# Patient Record
Sex: Female | Born: 1965 | Hispanic: Yes | Marital: Married | Smoking: Never smoker
Health system: Southern US, Community
[De-identification: ages and names within clinical notes are randomized; demographics above are authoritative.]

## PROBLEM LIST (undated history)

## (undated) DIAGNOSIS — Z789 Other specified health status: Secondary | ICD-10-CM

## (undated) HISTORY — PX: EYE SURGERY: SHX253

## (undated) HISTORY — DX: Other specified health status: Z78.9

---

## 2011-03-18 ENCOUNTER — Emergency Department: Payer: Self-pay | Admitting: Emergency Medicine

## 2016-06-13 ENCOUNTER — Ambulatory Visit: Payer: Self-pay | Attending: Oncology

## 2017-07-12 ENCOUNTER — Encounter (INDEPENDENT_AMBULATORY_CARE_PROVIDER_SITE_OTHER): Payer: Self-pay

## 2017-07-12 ENCOUNTER — Ambulatory Visit: Payer: Self-pay | Attending: Oncology

## 2017-07-12 ENCOUNTER — Other Ambulatory Visit: Payer: Self-pay

## 2017-07-12 ENCOUNTER — Ambulatory Visit
Admission: RE | Admit: 2017-07-12 | Discharge: 2017-07-12 | Disposition: A | Discharge status: Home / Self Care | Payer: Self-pay | Source: Ambulatory Visit | Attending: Oncology | Admitting: Oncology

## 2017-07-12 VITALS — BP 164/93 | HR 73 | Temp 97.0°F | Ht 63.0 in | Wt 109.0 lb

## 2017-07-12 DIAGNOSIS — N63 Unspecified lump in unspecified breast: Secondary | ICD-10-CM

## 2017-07-12 DIAGNOSIS — Z0000 Encounter for general adult medical examination without abnormal findings: Secondary | ICD-10-CM

## 2017-07-12 NOTE — Progress Notes (Signed)
Subjective:     Patient ID: Deanna Perry, female   DOB: 07/29/1965, 52 y.o.   MRN: [id number]  HPI   Review of Systems     Objective:   Physical Exam     Assessment:     none    Plan:     None

## 2017-07-12 NOTE — Progress Notes (Signed)
Subjective:     Patient ID: Deanna Perry, female   DOB: 07/16/1965, 52 y.o.   MRN: [id number]  HPI   Review of Systems     Objective:   Physical Exam  Pulmonary/Chest: Right breast exhibits no inverted nipple, no mass, no nipple discharge, no skin change and no tenderness. Left breast exhibits no inverted nipple, no mass, no nipple discharge, no skin change and no tenderness. Breasts are symmetrical.       Assessment:     52 year old hispanic patient presents for B[facility] clinic visit.  No prior mammogram.  Jaqui Laukaitis interpreted exam.  Patient is anxious.  Patient screened, and meets BCCCP eligibility.  Patient does not have insurance, Medicare or Medicaid.  Handout given on Affordable Care Act. Instructed patient on breast self awareness using teach back method.  CBE unremarkable.  No mass or lump palpated.    Plan:     Sent for bilateral screening mammogram.  Jaqui walked [facility].

## 2017-07-13 ENCOUNTER — Other Ambulatory Visit: Payer: Self-pay

## 2017-07-13 DIAGNOSIS — N63 Unspecified lump in unspecified breast: Secondary | ICD-10-CM

## 2017-08-11 ENCOUNTER — Ambulatory Visit
Admission: RE | Admit: 2017-08-11 | Discharge: 2017-08-11 | Disposition: A | Discharge status: Home / Self Care | Payer: Self-pay | Source: Ambulatory Visit | Attending: Oncology | Admitting: Oncology

## 2017-08-11 DIAGNOSIS — N63 Unspecified lump in unspecified breast: Secondary | ICD-10-CM

## 2017-08-14 ENCOUNTER — Other Ambulatory Visit: Payer: Self-pay | Admitting: *Deleted

## 2017-08-14 DIAGNOSIS — N63 Unspecified lump in unspecified breast: Secondary | ICD-10-CM

## 2017-08-23 ENCOUNTER — Other Ambulatory Visit: Payer: Self-pay | Admitting: *Deleted

## 2017-08-23 ENCOUNTER — Ambulatory Visit
Admission: RE | Admit: 2017-08-23 | Discharge: 2017-08-23 | Disposition: A | Discharge status: Home / Self Care | Payer: Self-pay | Source: Ambulatory Visit | Attending: Oncology | Admitting: Oncology

## 2017-08-23 DIAGNOSIS — N63 Unspecified lump in unspecified breast: Secondary | ICD-10-CM | POA: Insufficient documentation

## 2017-08-23 HISTORY — PX: BREAST BIOPSY: SHX20

## 2017-08-24 LAB — SURGICAL PATHOLOGY

## 2017-08-29 ENCOUNTER — Encounter: Payer: Self-pay | Admitting: *Deleted

## 2017-08-29 NOTE — Progress Notes (Signed)
Radiologist recommends surgical consult regarding concordance with biopsy/pathology.  Scheduled patient with Dr. Lemar Livings on Tuesday June 18,2019 at 10:45.  Patient to arrive at 10:15.  To bring photo ID, and current medications.

## 2017-10-03 ENCOUNTER — Ambulatory Visit: Payer: Self-pay

## 2017-10-03 ENCOUNTER — Encounter: Payer: Self-pay | Admitting: *Deleted

## 2017-10-03 ENCOUNTER — Ambulatory Visit (INDEPENDENT_AMBULATORY_CARE_PROVIDER_SITE_OTHER): Payer: Self-pay | Admitting: General Surgery

## 2017-10-03 VITALS — BP 152/98 | HR 71 | Resp 12 | Ht 63.0 in | Wt 110.0 lb

## 2017-10-03 DIAGNOSIS — N6312 Unspecified lump in the right breast, upper inner quadrant: Secondary | ICD-10-CM

## 2017-10-03 NOTE — Progress Notes (Signed)
Patient ID: Deanna Perry, female   DOB: [birth year], 52 y.o.   MRN: [id number]  Chief Complaint  Patient presents with  . Breast Problem    HPI Deanna Perry is a 52 y.o. female.  who presents for a breast evaluation post right breast biopsy referred by Molli Posey RN BCCCP. The most recent mammogram and biopsy was done on 08-23-17. She states she is nervous about the visit, friend present. Patient does perform regular self breast checks but could not feel anything different in the breast. Denies any breast injury or trauma. Her primary care recommended a mammogram, none prior. No pain post breast biopsy. She states she has been drinking tea (1 year) and she has lost 26 pounds  She is here with a friend, [name]. She works at [employer] in [employer]. Interpreter, Lendon Collar, present for interview, exam and discussion.  HPI  Past Medical History:  Diagnosis Date  . Patient denies medical problems     Past Surgical History:  Procedure Laterality Date  . BREAST BIOPSY Right 08/23/2017   FIBROTIC BREAST TISSUE, stereotatic /upper inner  . EYE SURGERY Left    over 30 yrs ago    Family History  Problem Relation Age of Onset  . Diabetes Father   . Colon cancer Neg Hx   . Breast cancer Neg Hx     Social History Social History   Tobacco Use  . Smoking status: Never Smoker  . Smokeless tobacco: Never Used  Substance Use Topics  . Alcohol use: Never    Frequency: Never  . Drug use: Never    No Known Allergies  Current Outpatient Medications  Medication Sig Dispense Refill  . Multiple Vitamin (MULTIVITAMIN) capsule Take 1 capsule by mouth daily.     No current facility-administered medications for this visit.     Review of Systems Review of Systems  Constitutional: Negative.   Respiratory: Negative.   Cardiovascular: Negative.     Blood pressure (!) 152/98, pulse 71, resp. rate 12, height 5\' 3"  (1.6 m), weight 110 lb (49.9 kg), SpO2 99  %.  Physical Exam Physical Exam  Constitutional: She is oriented to person, place, and time. She appears well-developed and well-nourished.  HENT:  Mouth/Throat: Oropharynx is clear and moist.  Eyes: Conjunctivae are normal. No scleral icterus.  Neck: Neck supple.  Cardiovascular: Normal rate, regular rhythm and normal heart sounds.  Pulmonary/Chest: Effort normal and breath sounds normal. Right breast exhibits no inverted nipple, no mass, no nipple discharge, no skin change and no tenderness. Left breast exhibits no inverted nipple, no mass, no nipple discharge, no skin change and no tenderness.  Left breast > right breast  Lymphadenopathy:    She has no cervical adenopathy.    She has no axillary adenopathy.  Neurological: She is alert and oriented to person, place, and time.  Skin: Skin is warm and dry.  Psychiatric: Her behavior is normal.    Data Reviewed Screening mammogram July 12, 2017 suggested asymmetry in the upper outer quadrant of the right breast.  Subsequent diagnostic study and ultrasound confirmed a focal area of abnormality.  The patient underwent a stereotactic biopsy on Aug 29, 2017 with the treating radiologist reporting the area of biopsy match the area of architectural and mammographic distortion.  Pathology was subsequently normal.  This is been listed as "discordant".  Review of the post biopsy films, although somewhat compromised as MLO views were obtained pre-biopsy and ML views obtained post biopsy suggested  for area of mammographic distortion had been removed.  Ultrasound examination of the 12 o'clock position of the right breast 5 cm from the nipple shows a focal hypoechoic area that is poorly defined without increased vascular flow noted on color-flow imaging.  BI-RADS-3.  Assessment    Abnormal mammogram, normal biopsy.    Plan    I broached the possibility of reviewing with the to radiate radiologist why he felt this was a discordant lesion when  normal breast parenchyma was included in his differential diagnosis.  The patient reported to the translator that any possibility that pathologic process is present would be worth undergoing an open biopsy.  Discussed excisional biopsy right breast mass with Outpatient Surgery     HPI, Physical Exam, Assessment and Plan have been scribed under the direction and in the presence of Earline MayotteJeffrey W. Laquinta Hazell, MD. Dorathy DaftMarsha Hatch, RN  I have completed the exam and reviewed the above documentation for accuracy and completeness.  I agree with the above.  Museum/gallery conservatorDragon Technology has been used and any errors in dictation or transcription are unintentional.  Donnalee CurryJeffrey Willye Javier, M.D., F.A.C.S.  Merrily PewJeffrey W Masaji Billups 10/03/2017, 9:01 PM

## 2017-10-03 NOTE — Patient Instructions (Addendum)
The patient is aware to call back for any questions or new concerns.  Discussed excisional biopsy right breast mass with Outpatient Surgery

## 2017-10-04 ENCOUNTER — Telehealth: Payer: Self-pay

## 2017-10-04 ENCOUNTER — Other Ambulatory Visit: Payer: Self-pay | Admitting: General Surgery

## 2017-10-04 DIAGNOSIS — N6312 Unspecified lump in the right breast, upper inner quadrant: Secondary | ICD-10-CM

## 2017-10-04 NOTE — Telephone Encounter (Signed)
Spoke with with patient's friend, Deanna Perry, to notify of surgery time and location and also Pre Admit date and time. [facility] at 9:30 am. She will pre admit at the hospital on 10/06/17 at 1:00 pm. She is aware of dates, times, and instructions. She may call to see if she can get a different day or time for pre admit testing.

## 2017-10-06 ENCOUNTER — Inpatient Hospital Stay: Admission: RE | Admit: 2017-10-06 | Payer: Self-pay | Source: Ambulatory Visit

## 2017-10-09 ENCOUNTER — Encounter
Admission: RE | Admit: 2017-10-09 | Discharge: 2017-10-09 | Disposition: A | Discharge status: Home / Self Care | Payer: Self-pay | Source: Ambulatory Visit | Attending: General Surgery | Admitting: General Surgery

## 2017-10-09 ENCOUNTER — Other Ambulatory Visit: Payer: Self-pay

## 2017-10-09 DIAGNOSIS — Z01818 Encounter for other preprocedural examination: Secondary | ICD-10-CM | POA: Insufficient documentation

## 2017-10-09 HISTORY — DX: Other specified health status: Z78.9

## 2017-10-09 NOTE — Patient Instructions (Addendum)
Your procedure is scheduled on: Friday October 13, 2017 9:40 am Su procedimiento est programado para: viernes el 28 de junio 2019 @ 9:40 am Report to Mammography, [facility]: [facility], Mammography  Remember: Instructions that are not followed completely may result in serious medical risk, up to and including death,  or upon the discretion of your surgeon and anesthesiologist your surgery may need to be rescheduled.  Recuerde: Las instrucciones que no se siguen completamente Armed forces logistics/support/administrative officer en un riesgo de salud grave, incluyendo hasta  la [place] o a discrecin de su cirujano y Scientific laboratory technician, su ciruga se puede posponer.   __X_ 1.Do not eat food after midnight the night before your procedure. No    gum chewing or hard candies. You may drink clear liquids up to 2 hours     before you are scheduled to arrive for your surgery- DO not drink clear     Liquids within 2 hours of the start of your surgery.     Clear Liquids include:    water, apple juice without pulp, clear carbohydrate drink such as    Clearfast of Gartorade, Black Coffee or Tea (Do not add anything to coffee or tea).      No coma nada despus de la medianoche de la noche anterior a su    procedimiento. No coma chicles ni caramelos duros. Puede tomar    lquidos claros hasta 2 horas antes de su hora programada de llegada al     hospital para su procedimiento. No tome lquidos claros durante el     transcurso de las 2 horas de su llegada programada al hospital para su     procedimiento, ya que esto puede llevar a que su procedimiento se    retrase o tenga que volver a Magazine features editor.  Los lquidos claros incluyen:          - Agua o jugo de [place] sin pulpa          - Bebidas claras con carbohidratos como ClearFast o Gatorade          - Caf negro o t claro (sin leche, sin cremas, no agregue nada al caf ni al t)  No tome nada que no est en esta lista.  Los pacientes con diabetes tipo 1  y tipo 2 solo deben Printmaker.  Llame a la clnica de PreCare o a la unidad de Same Day Surgery si  tiene alguna pregunta sobre estas instrucciones.              _X__ 2.Do Not Smoke or use e-cigarettes For 24 Hours Prior to Your Surgery.    Do not use any chewable tobacco products for at least 6   hours prior to surgery.    No fume ni use cigarrillos electrnicos durante las 24 horas previas    a su [place].  No use ningn producto de tabaco masticable durante   al menos 6 horas antes de la [place].     __X_ 3. No alcohol for 24 hours before or after surgery.    No tome alcohol durante las 24 horas antes ni despus de la [place].   ____4. Bring all medications with you on the day of surgery if instructed.    Lleve todos los medicamentos con usted el da de su ciruga si se le    ha indicado as.   ____ 5. Notify your doctor if there is any change in your medical condition (cold,fever, infections).  Informe a su mdico si hay algn cambio en su condicin mdica  (resfriado, fiebre, infecciones).   Do not wear jewelry, make-up, hairpins, clips or nail polish.  No use joyas, maquillajes, pinzas/ganchos para el cabello ni esmalte de uas.  Do not wear lotions, powders, or perfumes. You may wear deodorant.  No use lociones, polvos o perfumes.  Puede usar desodorante.    Do not shave 48 hours prior to surgery. Men may shave face and neck.  No se afeite 48 horas antes de la [place].  Los hombres pueden Commercial Metals Companyafeitarse la cara  y el cuello.   Do not bring valuables to the hospital.   No lleve objetos de valor al hospital.  [facility] is not responsible for any belongings or valuables.  [place] no se hace responsable de ningn tipo de pertenencias u objetos de Licensed conveyancervalor.               Contacts, dentures or bridgework may not be worn into surgery.  Los lentes de Letchercontacto, las dentaduras postizas o puentes no se pueden usar en la [place].   Leave your suitcase in the car. After surgery it may  be brought to your room.  Deje su maleta en el auto.  Despus de la ciruga podr traerla a su habitacin.   For patients admitted to the hospital, discharge time is determined by your  treatment team.  Para los pacientes que sean ingresados al hospital, el tiempo en el cual se le  dar de alta es determinado por su equipo de [place]tratamiento.   Patients discharged the day of surgery will not be allowed to drive home. A los pacientes que se les da de alta el mismo da de la ciruga no se les permitir conducir a Higher education careers advisercasa.   Please read over the following fact sheets that you were given: Por favor lea las siguientes hojas de informacin que le dieron:    _x___ Take these medicines the morning of surgery with A SIP OF WATER:          Owens-Illinoisome estas medicinas la maana de la ciruga con UN SORBO DE AGUA:  1. Nada (None)    _x___ Use CHG Soap as directed          Utilice el jabn de CHG segn lo indicado   _x___ Stop supplements now until after surgery            Deje de tomar suplementos hoy hasta despus de la [place]

## 2017-10-13 ENCOUNTER — Ambulatory Visit
Admission: RE | Admit: 2017-10-13 | Discharge: 2017-10-13 | Disposition: A | Discharge status: Home / Self Care | Payer: Self-pay | Source: Ambulatory Visit | Attending: General Surgery | Admitting: General Surgery

## 2017-10-13 ENCOUNTER — Ambulatory Visit: Payer: Self-pay | Admitting: Anesthesiology

## 2017-10-13 ENCOUNTER — Encounter
Admission: RE | Disposition: A | Discharge status: Home / Self Care | Payer: Self-pay | Source: Ambulatory Visit | Attending: General Surgery

## 2017-10-13 ENCOUNTER — Encounter: Payer: Self-pay | Admitting: *Deleted

## 2017-10-13 DIAGNOSIS — N6312 Unspecified lump in the right breast, upper inner quadrant: Secondary | ICD-10-CM

## 2017-10-13 DIAGNOSIS — N6031 Fibrosclerosis of right breast: Secondary | ICD-10-CM | POA: Insufficient documentation

## 2017-10-13 HISTORY — PX: BREAST EXCISIONAL BIOPSY: SUR124

## 2017-10-13 HISTORY — PX: BREAST BIOPSY: SHX20

## 2017-10-13 SURGERY — BREAST BIOPSY WITH NEEDLE LOCALIZATION
Anesthesia: General | Laterality: Right | Wound class: "Clean "

## 2017-10-13 MED ORDER — KETOROLAC TROMETHAMINE 30 MG/ML IJ SOLN
INTRAMUSCULAR | Status: AC
  Filled 2017-10-13: qty 1

## 2017-10-13 MED ORDER — PROPOFOL 10 MG/ML IV BOLUS
INTRAVENOUS | Status: DC | PRN
  Administered 2017-10-13: 150 mg via INTRAVENOUS

## 2017-10-13 MED ORDER — FENTANYL CITRATE (PF) 100 MCG/2ML IJ SOLN
INTRAMUSCULAR | Status: AC
  Filled 2017-10-13: qty 2

## 2017-10-13 MED ORDER — BUPIVACAINE-EPINEPHRINE 0.5% -1:200000 IJ SOLN
INTRAMUSCULAR | Status: DC | PRN
  Administered 2017-10-13: 20 mL

## 2017-10-13 MED ORDER — GABAPENTIN 300 MG PO CAPS
ORAL_CAPSULE | ORAL | Status: AC
  Administered 2017-10-13: 300 mg via ORAL
  Filled 2017-10-13: qty 1

## 2017-10-13 MED ORDER — MIDAZOLAM HCL 2 MG/2ML IJ SOLN
INTRAMUSCULAR | Status: DC | PRN
  Administered 2017-10-13: 2 mg via INTRAVENOUS

## 2017-10-13 MED ORDER — KETOROLAC TROMETHAMINE 30 MG/ML IJ SOLN
INTRAMUSCULAR | Status: DC | PRN
  Administered 2017-10-13: 30 mg via INTRAVENOUS

## 2017-10-13 MED ORDER — ONDANSETRON HCL 4 MG/2ML IJ SOLN
INTRAMUSCULAR | Status: DC | PRN
  Administered 2017-10-13: 4 mg via INTRAVENOUS

## 2017-10-13 MED ORDER — LIDOCAINE HCL (PF) 2 % IJ SOLN
INTRAMUSCULAR | Status: AC
  Filled 2017-10-13: qty 10

## 2017-10-13 MED ORDER — ACETAMINOPHEN 10 MG/ML IV SOLN
INTRAVENOUS | Status: AC
  Filled 2017-10-13: qty 100

## 2017-10-13 MED ORDER — CELECOXIB 200 MG PO CAPS
200.0000 mg | ORAL_CAPSULE | ORAL | Status: AC
  Administered 2017-10-13: 200 mg via ORAL

## 2017-10-13 MED ORDER — ONDANSETRON HCL 4 MG/2ML IJ SOLN
INTRAMUSCULAR | Status: AC
  Filled 2017-10-13: qty 2

## 2017-10-13 MED ORDER — CELECOXIB 200 MG PO CAPS
ORAL_CAPSULE | ORAL | Status: AC
  Administered 2017-10-13: 200 mg via ORAL
  Filled 2017-10-13: qty 1

## 2017-10-13 MED ORDER — ACETAMINOPHEN 10 MG/ML IV SOLN
INTRAVENOUS | Status: DC | PRN
  Administered 2017-10-13: 1000 mg via INTRAVENOUS

## 2017-10-13 MED ORDER — FAMOTIDINE 20 MG PO TABS
20.0000 mg | ORAL_TABLET | Freq: Once | ORAL | Status: AC
  Administered 2017-10-13: 20 mg via ORAL

## 2017-10-13 MED ORDER — DEXAMETHASONE SODIUM PHOSPHATE 10 MG/ML IJ SOLN
INTRAMUSCULAR | Status: DC | PRN
Start: 2017-10-13 — End: 2017-10-13
  Administered 2017-10-13: 4 mg via INTRAVENOUS

## 2017-10-13 MED ORDER — MIDAZOLAM HCL 2 MG/2ML IJ SOLN
INTRAMUSCULAR | Status: AC
  Filled 2017-10-13: qty 2

## 2017-10-13 MED ORDER — GABAPENTIN 300 MG PO CAPS
300.0000 mg | ORAL_CAPSULE | ORAL | Status: AC
  Administered 2017-10-13: 300 mg via ORAL

## 2017-10-13 MED ORDER — DEXAMETHASONE SODIUM PHOSPHATE 10 MG/ML IJ SOLN
INTRAMUSCULAR | Status: AC
  Filled 2017-10-13: qty 1

## 2017-10-13 MED ORDER — SEVOFLURANE IN SOLN
RESPIRATORY_TRACT | Status: AC
  Filled 2017-10-13: qty 250

## 2017-10-13 MED ORDER — HYDROCODONE-ACETAMINOPHEN 5-325 MG PO TABS: 1.0000 | ORAL_TABLET | ORAL | 0 refills | Status: AC | PRN

## 2017-10-13 MED ORDER — LACTATED RINGERS IV SOLN
INTRAVENOUS | Status: DC
  Administered 2017-10-13: 13:00:00 via INTRAVENOUS
  Administered 2017-10-13: 75 mL/h via INTRAVENOUS

## 2017-10-13 MED ORDER — ONDANSETRON HCL 4 MG/2ML IJ SOLN: 4.0000 mg | Freq: Once | INTRAMUSCULAR | Status: DC | PRN

## 2017-10-13 MED ORDER — FENTANYL CITRATE (PF) 100 MCG/2ML IJ SOLN
INTRAMUSCULAR | Status: DC | PRN
  Administered 2017-10-13 (×2): 25 ug via INTRAVENOUS

## 2017-10-13 MED ORDER — PROPOFOL 500 MG/50ML IV EMUL
INTRAVENOUS | Status: AC
  Filled 2017-10-13: qty 50

## 2017-10-13 MED ORDER — LIDOCAINE HCL (CARDIAC) PF 100 MG/5ML IV SOSY
PREFILLED_SYRINGE | INTRAVENOUS | Status: DC | PRN
  Administered 2017-10-13: 60 mg via INTRAVENOUS

## 2017-10-13 MED ORDER — FAMOTIDINE 20 MG PO TABS
ORAL_TABLET | ORAL | Status: AC
  Administered 2017-10-13: 20 mg via ORAL
  Filled 2017-10-13: qty 1

## 2017-10-13 MED ORDER — FENTANYL CITRATE (PF) 100 MCG/2ML IJ SOLN: 25.0000 ug | INTRAMUSCULAR | Status: DC | PRN

## 2017-10-13 SURGICAL SUPPLY — 43 items
BINDER BREAST LRG (GAUZE/BANDAGES/DRESSINGS) IMPLANT
BINDER BREAST MEDIUM (GAUZE/BANDAGES/DRESSINGS) ×2 IMPLANT
BINDER BREAST XLRG (GAUZE/BANDAGES/DRESSINGS) IMPLANT
BINDER BREAST XXLRG (GAUZE/BANDAGES/DRESSINGS) IMPLANT
BLADE SURG 15 STRL SS SAFETY (BLADE) ×6 IMPLANT
CANISTER SUCT 1200ML W/VALVE (MISCELLANEOUS) ×3 IMPLANT
CHLORAPREP W/TINT 26ML (MISCELLANEOUS) ×6 IMPLANT
CLOSURE WOUND 1/2 X4 (GAUZE/BANDAGES/DRESSINGS) ×2
CNTNR SPEC 2.5X3XGRAD LEK (MISCELLANEOUS)
CONT SPEC 4OZ STER OR WHT (MISCELLANEOUS)
CONTAINER SPEC 2.5X3XGRAD LEK (MISCELLANEOUS) ×1 IMPLANT
COVER PROBE FLX POLY STRL (MISCELLANEOUS) ×3 IMPLANT
DEVICE DUBIN SPECIMEN MAMMOGRA (MISCELLANEOUS) ×3 IMPLANT
DRAPE LAPAROTOMY 100X77 ABD (DRAPES) ×3 IMPLANT
DRSG GAUZE FLUFF 36X18 (GAUZE/BANDAGES/DRESSINGS) ×3 IMPLANT
DRSG TELFA 4X3 1S NADH ST (GAUZE/BANDAGES/DRESSINGS) ×6 IMPLANT
ELECT CAUTERY BLADE TIP 2.5 (TIP) ×3
ELECT REM PT RETURN 9FT ADLT (ELECTROSURGICAL) ×3
ELECTRODE CAUTERY BLDE TIP 2.5 (TIP) ×1 IMPLANT
ELECTRODE REM PT RTRN 9FT ADLT (ELECTROSURGICAL) ×1 IMPLANT
GLOVE BIO SURGEON STRL SZ7.5 (GLOVE) ×5 IMPLANT
GLOVE INDICATOR 8.0 STRL GRN (GLOVE) ×5 IMPLANT
GOWN STRL REUS W/ TWL LRG LVL3 (GOWN DISPOSABLE) ×2 IMPLANT
GOWN STRL REUS W/TWL LRG LVL3 (GOWN DISPOSABLE) ×4
KIT TURNOVER KIT A (KITS) ×3 IMPLANT
LABEL OR SOLS (LABEL) ×3 IMPLANT
MARGIN MAP 10MM (MISCELLANEOUS) ×3 IMPLANT
NDL HYPO 25X1 1.5 SAFETY (NEEDLE) ×1 IMPLANT
NEEDLE HYPO 22GX1.5 SAFETY (NEEDLE) ×3 IMPLANT
NEEDLE HYPO 25X1 1.5 SAFETY (NEEDLE) ×3 IMPLANT
PACK BASIN MINOR ARMC (MISCELLANEOUS) ×3 IMPLANT
RETRACTOR RING XSMALL (MISCELLANEOUS) IMPLANT
RTRCTR WOUND ALEXIS 13CM XS SH (MISCELLANEOUS)
STRIP CLOSURE SKIN 1/2X4 (GAUZE/BANDAGES/DRESSINGS) ×4 IMPLANT
SUT ETHILON 3-0 FS-10 30 BLK (SUTURE) ×3
SUT VIC AB 2-0 CT1 27 (SUTURE) ×2
SUT VIC AB 2-0 CT1 TAPERPNT 27 (SUTURE) ×1 IMPLANT
SUT VIC AB 4-0 FS2 27 (SUTURE) ×3 IMPLANT
SUTURE EHLN 3-0 FS-10 30 BLK (SUTURE) ×1 IMPLANT
SWABSTK COMLB BENZOIN TINCTURE (MISCELLANEOUS) ×6 IMPLANT
SYR 10ML LL (SYRINGE) ×3 IMPLANT
TAPE TRANSPORE STRL 2 31045 (GAUZE/BANDAGES/DRESSINGS) ×1 IMPLANT
WATER STERILE IRR 1000ML POUR (IV SOLUTION) ×3 IMPLANT

## 2017-10-13 NOTE — Anesthesia Procedure Notes (Signed)
Procedure Name: LMA Insertion Date/Time: 10/13/2017 12:53 PM Performed by: Oliva Bustardorbett, Florentina Marquart E, CRNA Pre-anesthesia Checklist: Patient identified, Emergency Drugs available, Suction available, Patient being monitored and Timeout performed Patient Re-evaluated:Patient Re-evaluated prior to induction Oxygen Delivery Method: Circle system utilized Preoxygenation: Pre-oxygenation with 100% oxygen Induction Type: IV induction LMA: LMA inserted LMA Size: 3.5 Number of attempts: 1 Placement Confirmation: positive ETCO2 Dental Injury: Teeth and Oropharynx as per pre-operative assessment

## 2017-10-13 NOTE — Op Note (Signed)
Preoperative diagnosis: Abnormal mammogram, benign stereotactic biopsy, discordant per radiology.  Postoperative diagnosis: Same.  Operative procedure: Wire localization excision of previous biopsy site.  Operating Surgeon: Donnalee CurryJeffrey Megon Kalina, MD.  Anesthesia: General by LMA, Marcaine 0.5% with 1 to 200,000 units of epinephrine, 20 cc.  Estimated blood loss: Less than 3 cc.  Clinical note: This 52 year old woman has screening mammogram with area of questionable density in the 12 o'clock position of the right breast.  Stereotactic biopsy showed fibrotic breast tissue without epithelial proliferation, atypia or malignancy.  This was felt to be discordant by the radiologist who recommended formal excision.  In discussion with the patient she desired to remove any doubt of pathology and for that reason is brought to the operating at this time having undergone wire localization of the previous site of distortion and clip placement.  Operative note: With the patient under adequate general anesthesia the area was cleansed with ChloraPrep and draped.  The area of the estimated clip and wire tip were identified by ultrasound in the area marked.  A curvilinear incision on the 11 to 12 o'clock position was made lateral to the wire insertion site.  Skin was incised sharply after installation of local anesthetic.  Tissue was deepened and the wire was brought into the wound.  A 1-1/2 cm diameter core of tissue down to and including pectoralis fascia was removed.  Specimen radiograph confirmed the entire wire tip, previous biopsy clip and area of distortion.  The breast parenchyma with an fascia was elevated off the underlying pectoralis muscle and this was then approximated with a single 2-0 Vicryl figure-of-eight suture.  The deep breast parenchyma was approximated in a similar fashion.  The adipose layer was approximated with a single 2-0 Vicryl simple suture.  The skin was closed with a running 4-0 Vicryl  subcuticular suture.  Benzoin, Steri-Strips, Telfa and Tegaderm dressing was applied.  The patient tolerated the procedure well was taken recovery room in stable condition.

## 2017-10-13 NOTE — Anesthesia Postprocedure Evaluation (Signed)
Anesthesia Post Note  Patient: Deanna Perry  Procedure(s) Performed: BREAST BIOPSY WITH NEEDLE LOCALIZATION (Right )  Patient location during evaluation: PACU Anesthesia Type: General Level of consciousness: awake and alert and oriented Pain management: pain level controlled Vital Signs Assessment: post-procedure vital signs reviewed and stable Respiratory status: spontaneous breathing Cardiovascular status: blood pressure returned to baseline Anesthetic complications: no     Last Vitals:  Vitals:   10/13/17 1431 10/13/17 1515  BP: 122/74 113/85  Pulse: 62 63  Resp: 16   Temp: 36.4 C   SpO2: 100% 99%    Last Pain:  Vitals:   10/13/17 1515  TempSrc:   PainSc: 0-No pain                 Emrie Gayle

## 2017-10-13 NOTE — Transfer of Care (Signed)
Immediate Anesthesia Transfer of Care Note  Patient: Deanna Perry  Procedure(s) Performed: BREAST BIOPSY WITH NEEDLE LOCALIZATION (Right )  Patient Location: PACU  Anesthesia Type:General  Level of Consciousness: awake  Airway & Oxygen Therapy: Patient Spontanous Breathing  Post-op Assessment: Report given to RN  Post vital signs: stable  Last Vitals:  Vitals Value Taken Time  BP 117/70 10/13/2017  1:40 PM  Temp 36.8 C 10/13/2017  1:40 PM  Pulse 78 10/13/2017  1:43 PM  Resp 12 10/13/2017  1:43 PM  SpO2 100 % 10/13/2017  1:43 PM  Vitals shown include unvalidated device data.  Last Pain:  Vitals:   10/13/17 1340  TempSrc:   PainSc: Asleep      Patients Stated Pain Goal: 0 (10/13/17 1039)  Complications: No apparent anesthesia complications

## 2017-10-13 NOTE — Anesthesia Preprocedure Evaluation (Signed)
Anesthesia Evaluation  Patient identified by MRN, date of birth, ID band  Reviewed: Allergy & Precautions, NPO status , Patient's Chart, lab work & pertinent test results  History of Anesthesia Complications Negative for: history of anesthetic complications  Airway Mallampati: III       Dental   Pulmonary neg sleep apnea, neg COPD,           Cardiovascular (-) hypertension(-) Past MI and (-) CHF (-) dysrhythmias (-) Valvular Problems/Murmurs     Neuro/Psych neg Seizures    GI/Hepatic Neg liver ROS, neg GERD  ,  Endo/Other  neg diabetes  Renal/GU negative Renal ROS     Musculoskeletal   Abdominal   Peds  Hematology   Anesthesia Other Findings   Reproductive/Obstetrics                             Anesthesia Physical Anesthesia Plan  ASA: I  Anesthesia Plan: General   Post-op Pain Management:    Induction:   PONV Risk Score and Plan: 3 and Ondansetron, Dexamethasone and Midazolam  Airway Management Planned: LMA  Additional Equipment:   Intra-op Plan:   Post-operative Plan:   Informed Consent: I have reviewed the patients History and Physical, chart, labs and discussed the procedure including the risks, benefits and alternatives for the proposed anesthesia with the patient or authorized representative who has indicated his/her understanding and acceptance.     Plan Discussed with:   Anesthesia Plan Comments:         Anesthesia Quick Evaluation

## 2017-10-13 NOTE — Anesthesia Post-op Follow-up Note (Signed)
Anesthesia QCDR form completed.        

## 2017-10-13 NOTE — H&P (Signed)
Patient with previous abnormal mammogram and normal biopsy.  For excisional biopsy to confirm the original biopsy was correct.

## 2017-10-14 ENCOUNTER — Encounter: Payer: Self-pay | Admitting: General Surgery

## 2017-10-17 LAB — SURGICAL PATHOLOGY

## 2017-10-18 ENCOUNTER — Telehealth: Payer: Self-pay

## 2017-10-18 NOTE — Telephone Encounter (Signed)
Notified patient as instructed, patient pleased. Discussed follow-up appointments, patient agrees. Will follow up with the nurse on 10/25/17.

## 2017-10-18 NOTE — Telephone Encounter (Signed)
-----   Message from Deanna MayotteJeffrey W Byrnett, MD sent at 10/17/2017  1:56 PM EDT ----- Please have the translator notify the patient that her breast biopsy from last week was fine. No cancer, just bruised tissue. Thanks.

## 2017-10-25 ENCOUNTER — Ambulatory Visit (INDEPENDENT_AMBULATORY_CARE_PROVIDER_SITE_OTHER): Payer: Self-pay | Admitting: *Deleted

## 2017-10-25 DIAGNOSIS — N6312 Unspecified lump in the right breast, upper inner quadrant: Secondary | ICD-10-CM

## 2017-10-25 NOTE — Patient Instructions (Signed)
The patient is aware to call back for any questions or concerns.  

## 2017-10-25 NOTE — Progress Notes (Signed)
Patient here today for follow up post right  breast biopsy.  Dressing removed, steristrip in place and aware it may come off in one week.  Minimal bruising noted.  The patient is aware that a heating pad may be used for comfort as needed.  Aware of pathology. Follow up as needed..Marland Kitchen

## 2017-11-03 NOTE — Progress Notes (Signed)
Biopsy results benign.  Patient to return in one year for screening.  Copy to [facility].

## 2018-09-19 ENCOUNTER — Ambulatory Visit: Payer: Self-pay

## 2019-08-05 IMAGING — US US BREAST*R* LIMITED INC AXILLA
1 series · 10 of 10 positions shown · non-contrast
Comparison: Previous exam(s).

CLINICAL DATA: Right upper inner breast focal asymmetry seen on
most recent screening mammography.

EXAM:
DIGITAL DIAGNOSTIC RIGHT MAMMOGRAM WITH CAD AND TOMO
ULTRASOUND RIGHT BREAST

[Series 1: us breast*right* limited inc axilla · 0.06mm/px · 10 of 10 slices shown]
[im 1/10]
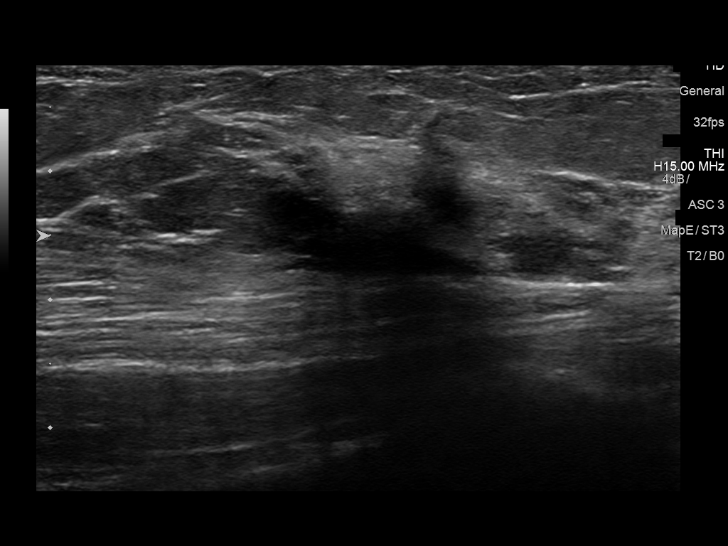
[im 2/10]
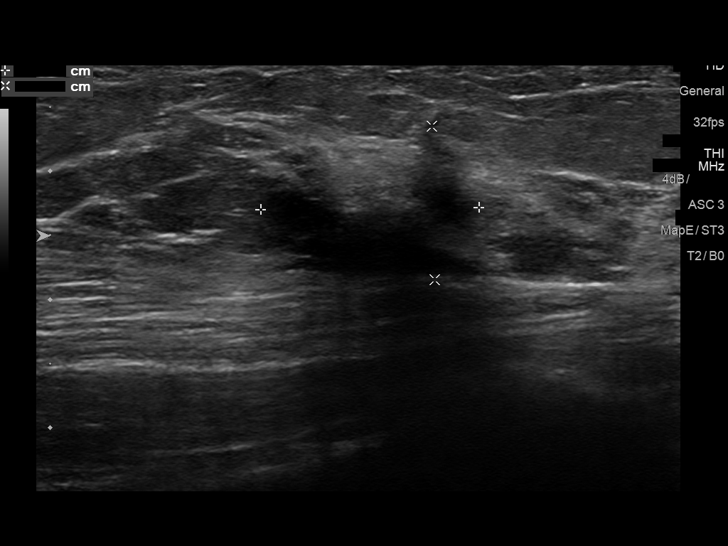
[im 3/10]
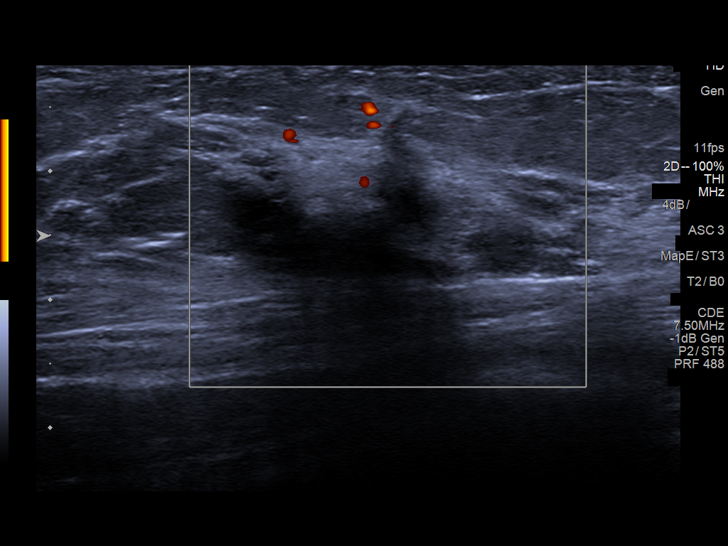
[im 4/10]
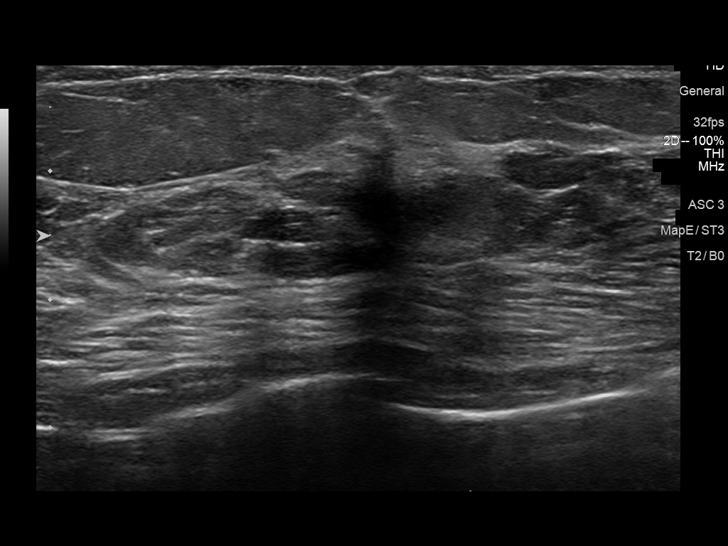
[im 5/10]
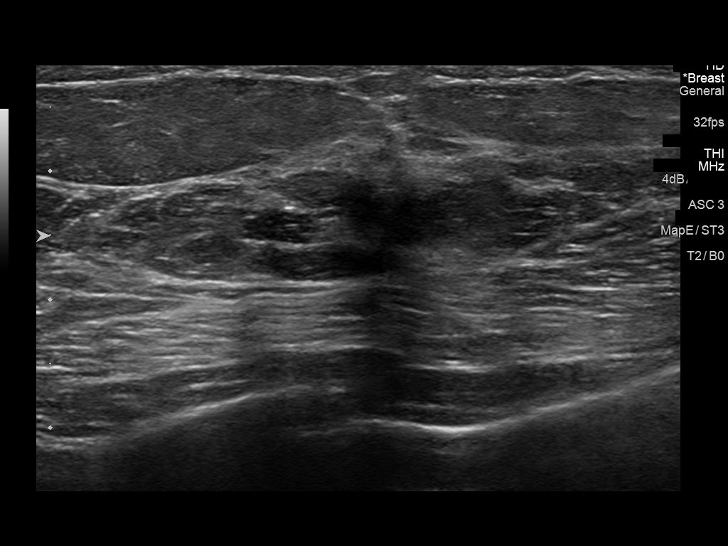
[im 6/10]
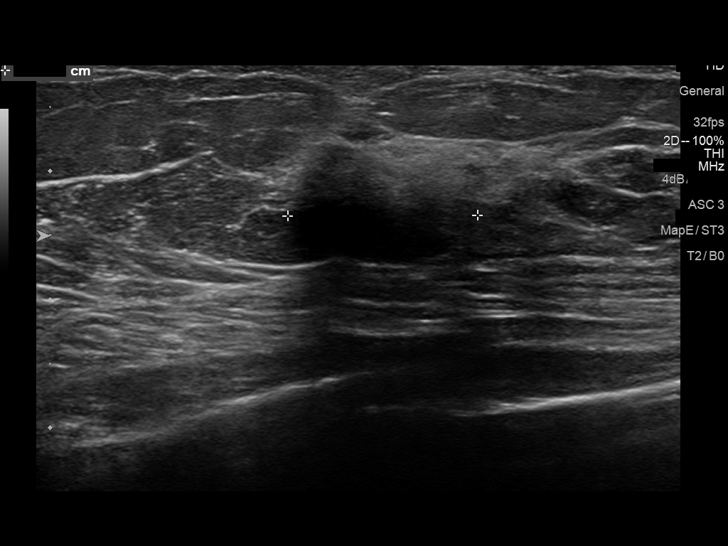
[im 7/10]
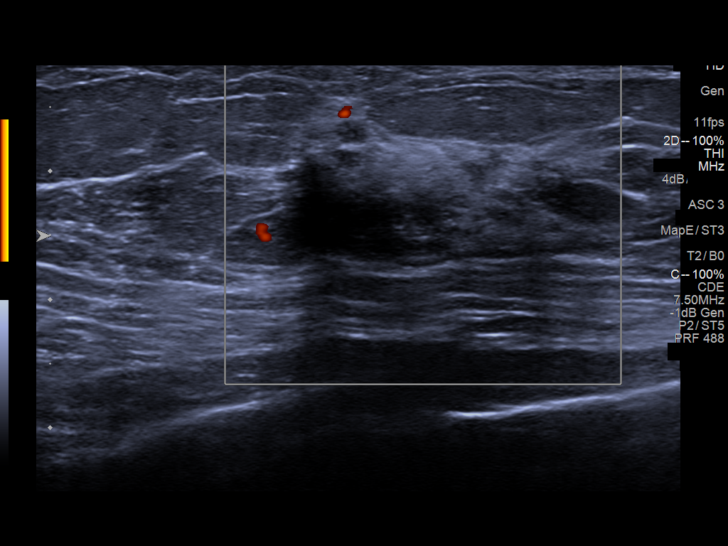
[im 8/10]
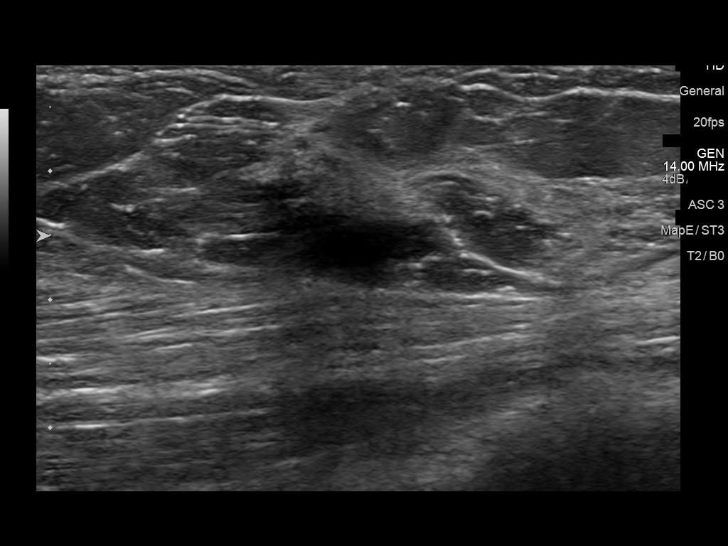
[im 9/10]
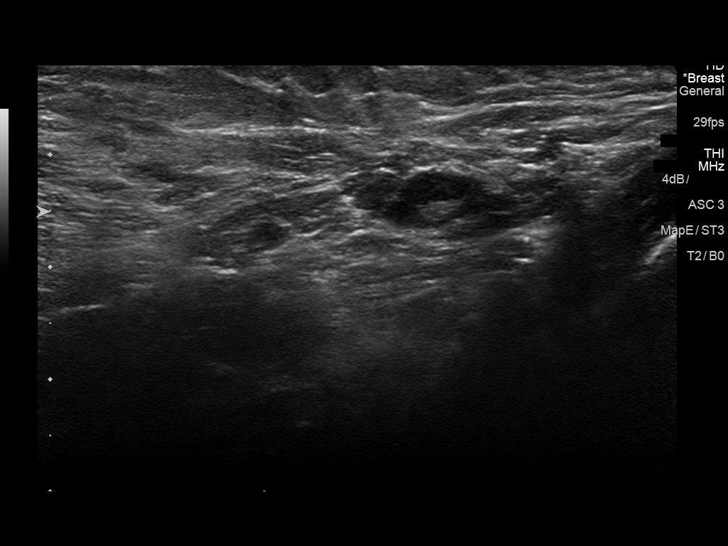
[im 10/10]
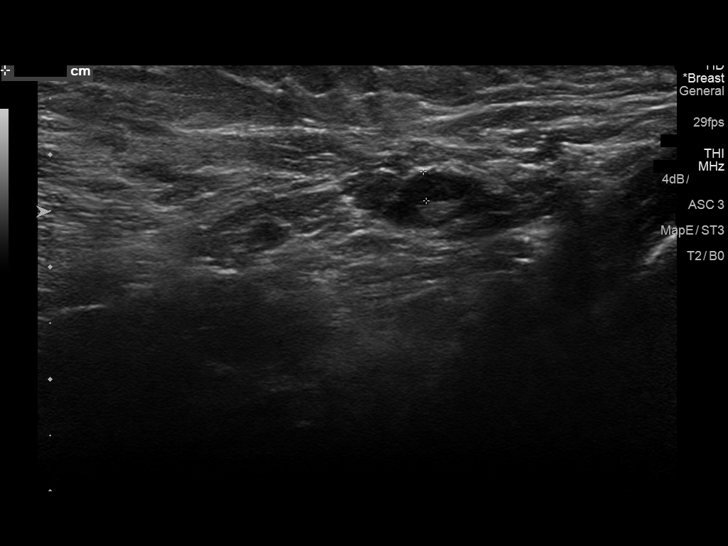

[10 of 10 positions shown; findings below may reference images not displayed]

ACR Breast Density Category c: The breast tissue is heterogeneously
dense, which may obscure small masses.
FINDINGS: Additional mammographic views of the right breast demonstrate an
ill-defined focal asymmetry in the right breast upper inner
quadrant, posterior depth, partially visualized due to its far
posterior location. The abnormality measures approximately 1.5 cm.
Adjacent to it, there is a probable lymph node measuring 7 mm.

Mammographic images were processed with CAD.

On physical exam, no suspicious masses are palpated.

Targeted ultrasound is performed, showing right breast 12:30 o'clock
5 cm from the nipple ill-defined hypoechoic area which measures
approximately 1.7 x 1.2 by 1.5 cm. This finding likely corresponds
to the mammographically seen focal asymmetry. No definite evidence
of right axillary lymphadenopathy.
IMPRESSION: Right breast upper inner quadrant focal asymmetry with probable
sonographic correlation at 12:30 o'clock.

RECOMMENDATION:
Ultrasound-guided core needle biopsy of right breast focal
asymmetry.

Attention on postprocedural mammogram is recommended to assure
mammographic/sonographic correlation.

I have discussed the findings and recommendations with the patient.
Results were also provided in writing at the conclusion of the
visit. If applicable, a reminder letter will be sent to the patient
regarding the next appointment.

BI-RADS CATEGORY  4: Suspicious.

## 2019-08-11 ENCOUNTER — Ambulatory Visit: Payer: Self-pay | Attending: Internal Medicine

## 2019-08-11 ENCOUNTER — Other Ambulatory Visit: Payer: Self-pay

## 2019-08-11 DIAGNOSIS — Z23 Encounter for immunization: Secondary | ICD-10-CM

## 2019-08-11 NOTE — Progress Notes (Signed)
   Covid-19 Vaccination Clinic  Name:  Katilynn Sinkler    MRN: [id number] DOB: 1965/12/02  08/11/2019  [name] was observed post Covid-19 immunization for 15 minutes without incident. She was provided with Vaccine Information Sheet and instruction to access the V-Safe system.   Ms. Dura was instructed to call 911 with any severe reactions post vaccine: Marland Kitchen Difficulty breathing  . Swelling of face and throat  . A fast heartbeat  . A bad rash all over body  . Dizziness and weakness   Immunizations Administered    Name Date Dose VIS Date Route   Pfizer COVID-19 Vaccine 08/11/2019  3:35 PM 0.3 mL 06/12/2018 Intramuscular   Manufacturer: [employer], [employer]   Lot: [id number]   NDC: [address]-2

## 2019-08-16 ENCOUNTER — Ambulatory Visit: Payer: Self-pay

## 2019-08-23 ENCOUNTER — Ambulatory Visit: Payer: Self-pay

## 2019-09-03 ENCOUNTER — Ambulatory Visit: Payer: Self-pay | Attending: Internal Medicine

## 2019-09-03 DIAGNOSIS — Z23 Encounter for immunization: Secondary | ICD-10-CM

## 2019-09-03 NOTE — Progress Notes (Signed)
   Covid-19 Vaccination Clinic  Name:  Deanna Perry    MRN: [id number] DOB: 1965-11-10  09/03/2019  [name] was observed post Covid-19 immunization for 15 minutes without incident. She was provided with Vaccine Information Sheet and instruction to access the V-Safe system.   [name] was instructed to call 911 with any severe reactions post vaccine: Marland Kitchen Difficulty breathing  . Swelling of face and throat  . A fast heartbeat  . A bad rash all over body  . Dizziness and weakness   Immunizations Administered    Name Date Dose VIS Date Route   Pfizer COVID-19 Vaccine 09/03/2019  1:54 PM 0.3 mL 06/12/2018 Intramuscular   Manufacturer: [employer], [employer]   Lot: [id number]   NDC: [address]-1

## 2020-08-14 ENCOUNTER — Emergency Department
Admission: EM | Admit: 2020-08-14 | Discharge: 2020-08-14 | Disposition: A | Discharge status: Home / Self Care | Payer: No Typology Code available for payment source | Attending: Emergency Medicine | Admitting: Emergency Medicine

## 2020-08-14 ENCOUNTER — Other Ambulatory Visit: Payer: Self-pay

## 2020-08-14 ENCOUNTER — Encounter: Payer: Self-pay | Admitting: Emergency Medicine

## 2020-08-14 ENCOUNTER — Emergency Department: Payer: No Typology Code available for payment source

## 2020-08-14 DIAGNOSIS — Y92410 Unspecified street and highway as the place of occurrence of the external cause: Secondary | ICD-10-CM | POA: Diagnosis not present

## 2020-08-14 DIAGNOSIS — S29011A Strain of muscle and tendon of front wall of thorax, initial encounter: Secondary | ICD-10-CM | POA: Insufficient documentation

## 2020-08-14 DIAGNOSIS — S299XXA Unspecified injury of thorax, initial encounter: Secondary | ICD-10-CM | POA: Diagnosis present

## 2020-08-14 NOTE — Discharge Instructions (Signed)
Follow-up with your primary care provider if any continued problems or concerns.  Ibuprofen or Tylenol as needed for pain.
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
## 2021-09-07 ENCOUNTER — Other Ambulatory Visit: Payer: Self-pay | Admitting: Primary Care

## 2021-11-01 ENCOUNTER — Other Ambulatory Visit: Payer: Self-pay

## 2021-11-01 DIAGNOSIS — Z1231 Encounter for screening mammogram for malignant neoplasm of breast: Secondary | ICD-10-CM

## 2021-11-02 ENCOUNTER — Ambulatory Visit: Payer: Self-pay | Attending: Hematology and Oncology | Admitting: *Deleted

## 2021-11-02 ENCOUNTER — Other Ambulatory Visit: Payer: Self-pay

## 2021-11-02 ENCOUNTER — Ambulatory Visit
Admission: RE | Admit: 2021-11-02 | Discharge: 2021-11-02 | Disposition: A | Discharge status: Home / Self Care | Payer: Self-pay | Source: Ambulatory Visit | Attending: Obstetrics and Gynecology | Admitting: Obstetrics and Gynecology

## 2021-11-02 VITALS — BP 167/91 | Wt 120.5 lb

## 2021-11-02 DIAGNOSIS — Z1231 Encounter for screening mammogram for malignant neoplasm of breast: Secondary | ICD-10-CM | POA: Insufficient documentation

## 2021-11-02 DIAGNOSIS — Z1211 Encounter for screening for malignant neoplasm of colon: Secondary | ICD-10-CM

## 2021-11-02 DIAGNOSIS — Z01419 Encounter for gynecological examination (general) (routine) without abnormal findings: Secondary | ICD-10-CM

## 2021-11-02 DIAGNOSIS — Z124 Encounter for screening for malignant neoplasm of cervix: Secondary | ICD-10-CM

## 2021-11-02 NOTE — Progress Notes (Signed)
[name] is a 56 y.o. [id number] female who presents to [facility] clinic today with no complaints. Here today for a well woman exam.    Pap Smear: Pap smear completed today. Patient has never had a pap smear.  Breasts Physical Exam Exam conducted with a chaperone present.  Chest:  Breasts:    Breasts are symmetrical.     Right: No swelling, bleeding, inverted nipple, mass, nipple discharge, skin change or tenderness.     Left: No swelling, bleeding, inverted nipple, mass, nipple discharge, skin change or tenderness.  Abdominal:     Palpations: There is no hepatomegaly or splenomegaly.  Genitourinary:    Exam position: Lithotomy position.     Vagina: No signs of injury and foreign body. No vaginal discharge, erythema, tenderness, bleeding, lesions or prolapsed vaginal walls.     Cervix: No cervical motion tenderness, discharge, friability, lesion or erythema.     Uterus: Not deviated and not enlarged.      Adnexa:        Right: No mass.         Left: No mass.       Rectum: No mass.          Comments: Very stenosed os - patient is nullaparous Lymphadenopathy:     Upper Body:     Right upper body: No supraclavicular or axillary adenopathy.     Left upper body: No supraclavicular or axillary adenopathy.      Smoking History: Patient has never smoked.      Patient Navigation: Patient education provided. Access to services provided for patient through B[facility]CP program. Kandis Cocking, the [facility] interpreter provided interpretation. No transportation provided   Colorectal Cancer Screening: Per patient has never had colonoscopy completed. No complaints today. FIT test given with instructions   Breast and Cervical Cancer Risk Assessment: Patient does not have family history of breast cancer, known genetic mutations, or radiation treatment to the chest before age 66. Patient does not have history of cervical dysplasia, immunocompromised, or DES exposure in-utero.  Risk Assessment      Risk Scores       11/02/2021   Last edited by: Narda Rutherford, LPN   5-year risk: 1.3 %   Lifetime risk: 9.6 %            A: BCCCP exam with pap smear P: Referred patient to the [facility] for a screening mammogram. Appointment scheduled today.  Jim Like, RN 11/02/2021 9:52 AM

## 2021-11-02 NOTE — Progress Notes (Deleted)
  Subjective:     Patient ID: Deanna Perry, female   DOB: [birth year], 56 y.o.   MRN: [id number]  HPI   Review of Systems     Objective:   Physical Exam Exam conducted with a chaperone present.  Chest:  Breasts:    Breasts are symmetrical.     Right: No swelling, bleeding, inverted nipple, mass, nipple discharge, skin change or tenderness.     Left: No swelling, bleeding, inverted nipple, mass, nipple discharge, skin change or tenderness.  Abdominal:     Palpations: There is no hepatomegaly or splenomegaly.  Genitourinary:    Exam position: Lithotomy position.     Vagina: No signs of injury and foreign body. No vaginal discharge, erythema, tenderness, bleeding, lesions or prolapsed vaginal walls.     Cervix: No cervical motion tenderness, discharge, friability, lesion or erythema.     Uterus: Not deviated and not enlarged.      Adnexa:        Right: No mass.         Left: No mass.       Rectum: No mass.        Comments: Very stenosed os - patient is nullaparous Lymphadenopathy:     Upper Body:     Right upper body: No supraclavicular or axillary adenopathy.     Left upper body: No supraclavicular or axillary adenopathy.      Assessment:     ***    Plan:     ***

## 2021-11-04 LAB — CYTOLOGY - PAP
Comment: NEGATIVE
Diagnosis: NEGATIVE
High risk HPV: NEGATIVE

## 2021-11-05 ENCOUNTER — Telehealth: Payer: Self-pay

## 2021-11-05 NOTE — Telephone Encounter (Signed)
Via Delorise Royals, Spanish Interpreter [facility]), Attempted contact patient regarding lab results (Pap test). Left message on voicmail requesting a return call.

## 2021-11-10 LAB — FECAL OCCULT BLOOD, IMMUNOCHEMICAL: Fecal Occult Bld: NEGATIVE

## 2021-11-15 ENCOUNTER — Telehealth: Payer: Self-pay

## 2021-11-15 NOTE — Telephone Encounter (Signed)
Via Delorise Royals, Spanish Interpreter [facility]), attempted to contact patient regarding  lab results. Left message on voicemail requesting a return call.

## 2021-11-17 ENCOUNTER — Telehealth: Payer: Self-pay

## 2021-11-17 NOTE — Telephone Encounter (Signed)
Via Delorise Royals, Spanish Interpreter [facility]), Patient informed negative FIT test results, negative Pap/HPV results, repeat pap in 3-5 years. Patient verbalized understanding.

## 2023-01-30 ENCOUNTER — Ambulatory Visit: Payer: Self-pay
